# Patient Record
Sex: Male | Born: 2005 | Race: White | Hispanic: No | Marital: Single | Smoking: Never smoker
Health system: Southern US, Community
[De-identification: ages and names within clinical notes are randomized; demographics above are authoritative.]

## PROBLEM LIST (undated history)

## (undated) DIAGNOSIS — T7840XA Allergy, unspecified, initial encounter: Secondary | ICD-10-CM

## (undated) DIAGNOSIS — F840 Autistic disorder: Secondary | ICD-10-CM

## (undated) DIAGNOSIS — J45909 Unspecified asthma, uncomplicated: Secondary | ICD-10-CM

## (undated) DIAGNOSIS — K200 Eosinophilic esophagitis: Secondary | ICD-10-CM

## (undated) HISTORY — PX: UPPER GASTROINTESTINAL ENDOSCOPY: SHX188

## (undated) HISTORY — DX: Eosinophilic esophagitis: K20.0

## (undated) HISTORY — DX: Autistic disorder: F84.0

## (undated) HISTORY — DX: Allergy, unspecified, initial encounter: T78.40XA

## (undated) HISTORY — PX: CIRCUMCISION: SUR203

---
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
## 2023-01-08 ENCOUNTER — Encounter (INDEPENDENT_AMBULATORY_CARE_PROVIDER_SITE_OTHER): Payer: Self-pay | Admitting: Otolaryngology

## 2023-01-08 ENCOUNTER — Ambulatory Visit: Payer: BC Managed Care – PPO | Admitting: Family Medicine

## 2023-01-08 ENCOUNTER — Encounter: Payer: Self-pay | Admitting: Family Medicine

## 2023-01-08 VITALS — BP 116/78 | HR 97 | Temp 98.0°F | Resp 16 | Ht 72.0 in | Wt 217.6 lb

## 2023-01-08 DIAGNOSIS — R0989 Other specified symptoms and signs involving the circulatory and respiratory systems: Secondary | ICD-10-CM

## 2023-01-08 NOTE — Patient Instructions (Signed)
 If you do not hear anything about your referral in the next 1-2 weeks, call our office and ask for an update.  Let me know if anything changes.

## 2023-01-08 NOTE — Progress Notes (Signed)
 Chief Complaint  Patient presents with   Sore Throat    Sore throat     Subjective: Patient is a 17 y.o. male here for throat tightness. Here w mom.   7-10 d ago, started having tightness in his throat. No trouble swallowing pills, liquids, solids. No pain, trauma, sinus drainage, swelling, itching, rashes. Not improving. No inciting event. Has not tried anything at home thus far.   Past Medical History:  Diagnosis Date   Asthma due to environmental allergies    cats, illness   Autistic disorder, current or active state    Eosinophilic esophagitis     Objective: BP 116/78   Pulse 97   Temp 98 F (36.7 C) (Oral)   Resp 16   Ht 6' (1.829 m)   Wt 217 lb 9.6 oz (98.7 kg)   SpO2 98%   BMI 29.51 kg/m  General: Awake, appears stated age Neck: No asymmetry or masses; denies any TTP but did seem uncomfortable when palpating his SCM musculature and thyroid; no thyromegaly Mouth: MMM, no asymmetry or edema appreciated, no pharyngeal exudate or erythema Heart: RRR Lungs: CTAB, no rales, wheezes or rhonchi. No accessory muscle use Psych: normal affect  Assessment and Plan: Throat tightness - Plan: Ambulatory referral to ENT  I do not appreciate any thyromegaly today.  Unlikely to be thyroiditis.  Will refer to ENT for further evaluation and possible scope. The patient and his mother voiced understanding and agreement to the plan.  Mabel [place], DO 01/08/23  1:39 PM

## 2023-01-15 ENCOUNTER — Encounter (INDEPENDENT_AMBULATORY_CARE_PROVIDER_SITE_OTHER): Payer: Self-pay | Admitting: Otolaryngology

## 2023-03-14 ENCOUNTER — Encounter (INDEPENDENT_AMBULATORY_CARE_PROVIDER_SITE_OTHER): Payer: Self-pay | Admitting: Otolaryngology

## 2023-03-14 ENCOUNTER — Ambulatory Visit (INDEPENDENT_AMBULATORY_CARE_PROVIDER_SITE_OTHER): Payer: Self-pay | Admitting: Otolaryngology

## 2023-03-14 VITALS — BP 111/71 | HR 91 | Ht 72.0 in | Wt 220.0 lb

## 2023-03-14 DIAGNOSIS — K219 Gastro-esophageal reflux disease without esophagitis: Secondary | ICD-10-CM

## 2023-03-14 DIAGNOSIS — J385 Laryngeal spasm: Secondary | ICD-10-CM

## 2023-03-14 DIAGNOSIS — R0989 Other specified symptoms and signs involving the circulatory and respiratory systems: Secondary | ICD-10-CM

## 2023-03-14 DIAGNOSIS — R0982 Postnasal drip: Secondary | ICD-10-CM | POA: Diagnosis not present

## 2023-03-14 DIAGNOSIS — K200 Eosinophilic esophagitis: Secondary | ICD-10-CM | POA: Diagnosis not present

## 2023-03-14 DIAGNOSIS — J3089 Other allergic rhinitis: Secondary | ICD-10-CM | POA: Diagnosis not present

## 2023-03-14 DIAGNOSIS — J343 Hypertrophy of nasal turbinates: Secondary | ICD-10-CM

## 2023-03-14 MED ORDER — FLUTICASONE PROPIONATE 50 MCG/ACT NA SUSP: 2.0000 | Freq: Every day | NASAL | 6 refills | Status: DC

## 2023-03-14 NOTE — Progress Notes (Signed)
 ENT CONSULT:  Reason for Consult: throat tightness    HPI: Discussed the use of AI scribe software for clinical note transcription with the patient, who gave verbal consent to proceed.  History of Present Illness   Wayne Richardson is a 18 year old male with hx of autism, eosinophilic esophagitis who presents with throat tightness at night. He is accompanied by a caregiver who assists with communication during the visit.  He experiences intermittent throat tightness, describing the sensation as 'weird' and is mostly happening at night. No pain when swallowing, shortness of breath, or difficulty with talking or breathing. He was previously seen by [facility] peds GI for EoE and based on record review is considered in remission. He is on PPI 40 mg BID.   He has a history of eosinophilic esophagitis diagnosed approximately two years ago, with multiple upper endoscopies performed for diagnostic purposes. He has not been on steroids for this condition.  He is currently taking Omeprazole, a proton pump inhibitor, at a dose of 40 mg twice daily. He manages environmental allergies, including dust and pollen, with Claritin, having recently switched from Zyrtec. He does not use nasal sprays regularly.      Records Reviewed:  [facility] 03/10/22 History of Present Illness: Wayne Richardson is a 18 y.o. old male with atopy, autism, and EOE who presents to clinic for follow up evaluation. Wayne Richardson was last seen on 05/09/21.   Patient had EGD in 03/23/21 that did not show any eosinophils in esophagus and at follow up appointment in 05/2021, patient appeared in clinical remission.   Today, patient continues to feel well. He denies any dysphagia, odynophagia, need to regurgitate or vomit the ingested food. He has been able to eat well and maintain weight. He denies nausea, abdominal pain, diarrhea, constipation, hematochezia, melena, weight loss, yellow skin discoloration, abdominal distention, confusion. He remains  compliant with PPI BID and takes one 30 min before breakfast and one before 30 min before supper. Denies NSAID's use, smoking, alcohol or drug use.   03/23/21 EGD: On PPI BID Final Pathologic Diagnosis   A. "DUODENUM", BIOPSY: Peptic duodenitis (foveolar metaplasia and chronic inflammation). No evidence of celiac disease. B. "DISTAL ESOPHAGUS", BIOPSY: Esophageal squamous mucosa with no diagnostic abnormality. No eosinophils. C. "MID ESOPHAGUS", BIOPSY: Esophageal squamous mucosa with no diagnostic abnormality. No eosinophils. D. "PROXIMAL ESOPHAGUS", BIOPSY: Esophageal squamous mucosa with no diagnostic abnormality. No eosinophils.     Past Medical History:  Diagnosis Date   Asthma due to environmental allergies    cats, illness   Autistic disorder, current or active state    Eosinophilic esophagitis     Past Surgical History:  Procedure Laterality Date   FOREARM SURGERY  2018    Family History  Problem Relation Age of Onset   Cancer Father        sarcoma   Heart disease Neg Hx     Social History:  reports that he has never smoked. He has never used smokeless tobacco. He reports that he does not drink alcohol and does not use drugs.  Allergies:  Allergies  Allergen Reactions   Erythromycin Rash    Rash on arms and legs.    Medications: I have reviewed the patient's current medications.  The PMH, PSH, Medications, Allergies, and SH were reviewed and updated.  ROS: Constitutional: Negative for fever, weight loss and weight gain. Cardiovascular: Negative for chest pain and dyspnea on exertion. Respiratory: Is not experiencing shortness of breath at rest. Gastrointestinal:  Negative for nausea and vomiting. Neurological: Negative for headaches. Psychiatric: The patient is not nervous/anxious  Blood pressure 111/71, pulse 91, height 6' (1.829 m), weight 220 lb (99.8 kg), SpO2 97%. Body mass index is 29.84 kg/m.  PHYSICAL EXAM:  Exam: General:  Well-developed, well-nourished Communication and Voice: Clear pitch and clarity Respiratory Respiratory effort: Equal inspiration and expiration without stridor Cardiovascular Peripheral Vascular: Warm extremities with equal color/perfusion Eyes: No nystagmus with equal extraocular motion bilaterally Neuro/Psych/Balance: Patient oriented to person, place, and time; Appropriate mood and affect; Gait is intact with no imbalance; Cranial nerves I-XII are intact Head and Face Inspection: Normocephalic and atraumatic without mass or lesion Palpation: Facial skeleton intact without bony stepoffs Salivary Glands: No mass or tenderness Facial Strength: Facial motility symmetric and full bilaterally ENT Pinna: External ear intact and fully developed External canal: Canal is patent with intact skin Tympanic Membrane: Clear and mobile External Nose: No scar or anatomic deformity Internal Nose: Septum is deviated to the left. No polyp, or purulence. Mucosal edema and erythema present.  Bilateral inferior turbinate hypertrophy.  Lips, Teeth, and gums: Mucosa and teeth intact and viable TMJ: No pain to palpation with full mobility Oral cavity/oropharynx: No erythema or exudate, no lesions present Nasopharynx: No mass or lesion with intact mucosa Hypopharynx: Intact mucosa without pooling of secretions Larynx Glottic: Full true vocal cord mobility without lesion or mass Supraglottic: Normal appearing epiglottis and AE folds Interarytenoid Space: Moderate pachydermia&edema Subglottic Space: Patent without lesion or edema Neck Neck and Trachea: Midline trachea without mass or lesion Thyroid: No mass or nodularity Lymphatics: No lymphadenopathy  Procedure: Preoperative diagnosis: throat tightness episodes   Postoperative diagnosis:   Same + GERD LPR  Procedure: Flexible fiberoptic laryngoscopy  Surgeon: Ashok Croon, MD  Anesthesia: Topical lidocaine and Afrin Complications:  None Condition is stable throughout exam  Indications and consent:  The patient presents to the clinic with above symptoms. Indirect laryngoscopy view was incomplete. Thus it was recommended that they undergo a flexible fiberoptic laryngoscopy. All of the risks, benefits, and potential complications were reviewed with the patient preoperatively and verbal informed consent was obtained.  Procedure: The patient was seated upright in the clinic. Topical lidocaine and Afrin were applied to the nasal cavity. After adequate anesthesia had occurred, I then proceeded to pass the flexible telescope into the nasal cavity. The nasal cavity was patent without rhinorrhea or polyp. The nasopharynx was also patent without mass or lesion. The base of tongue was visualized and was normal. There were no signs of pooling of secretions in the piriform sinuses. The true vocal folds were mobile bilaterally. There were no signs of glottic or supraglottic mucosal lesion or mass. There was moderate interarytenoid pachydermia and post cricoid edema. The telescope was then slowly withdrawn and the patient tolerated the procedure throughout.     Assessment/Plan: Encounter Diagnoses  Name Primary?   Eosinophilic esophagitis    Throat tightness    Chronic GERD    Laryngospasm    Sleep related laryngospasm Yes   Environmental and seasonal allergies    Hypertrophy of both inferior nasal turbinates    Post-nasal drip     Assessment and Plan    Throat tightness  Episodes of throat tightness mostly at night, hx of GERD and EoE, on PPI 40 mg Omeprazole BID. Considered in remission for EoE based on 2023 notes from Peds GI. Denies dysphagia or odynophagia.  Flexible scope exam with no masses or lesions no adduction on inspiration, subglottis proximal trachea  patent w/o scar - likely GERD triggered laryngospasm 2/2 sx description and nocturnal sx  - continue PPI 40 mg BID  -  Reflux Gourmet after meals and prior to bedtime   - diet and lifestyle changes to minimize GERD - Refer to [employer] blog for dietary and lifestyle modifications/reflux cook book   Eosinophilic esophagitis (EOE) Diagnosed with EOE approximately two years ago, currently managed with Omeprazole (PPI) 40 mg twice daily. Records indicate he was considered in remission in 2023, no adult GI yet, was recently discharged from Peds GI 2/2 age. We discussed that EOE is chronic and can relapse, typically managed by GI specialists. PPIs are beneficial for his anti-inflammatory properties and acid control. . - Continue Comprazole 40 mg twice daily - refer to adult GI to establish care   Environmental allergies and chronic nasal congestion post-nasal drip Environmental allergies managed with Claritin. Switched from Zyrtec to Claritin two weeks ago. Allergies may contribute to postnasal drainage, potentially exacerbating throat symptoms. Flonase nasal spray recommended for improved control of postnasal drainage. - Continue Claritin for allergy management - Add Flonase nasal spray for additional control of postnasal drainage  Thank you for allowing me to participate in the care of this patient. Please do not hesitate to contact me with any questions or concerns.   Ashok Croon, MD Otolaryngology [facility] Phone: [phone] Fax: [phone]    03/14/2023, 10:37 AM

## 2023-03-14 NOTE — Patient Instructions (Signed)

## 2023-03-19 ENCOUNTER — Other Ambulatory Visit: Payer: Self-pay | Admitting: Family Medicine

## 2023-04-01 ENCOUNTER — Encounter: Payer: Self-pay | Admitting: Family Medicine

## 2023-04-01 ENCOUNTER — Ambulatory Visit (INDEPENDENT_AMBULATORY_CARE_PROVIDER_SITE_OTHER): Admitting: Family Medicine

## 2023-04-01 VITALS — BP 128/76 | HR 102 | Ht 72.0 in | Wt 212.6 lb

## 2023-04-01 DIAGNOSIS — K200 Eosinophilic esophagitis: Secondary | ICD-10-CM

## 2023-04-01 DIAGNOSIS — R195 Other fecal abnormalities: Secondary | ICD-10-CM

## 2023-04-01 NOTE — Progress Notes (Signed)
 Chief Complaint  Patient presents with   Acute Visit    Patient presents today for constipation for a month. He is taking miralax that's not helping.    Subjective: Patient is a 18 y.o. male here for constipation. Here w mom.   This is been going on for 1 month.  He tried MiraLAX at home without relief.  No bleeding, abdominal pain, unintentional weight loss, nausea, vomiting.  He is passing gas. Goes once a day. Points to type 6 stool on BSC.  Of note, he does have a history of eosinophilic esophagitis and does not have a new GI doctor at this time.  Past Medical History:  Diagnosis Date   Asthma due to environmental allergies    cats, illness   Autistic disorder, current or active state    Eosinophilic esophagitis     Objective: BP 128/76   Pulse (!) 102   Ht 6' (1.829 m)   Wt 212 lb 9.6 oz (96.4 kg)   SpO2 99%   BMI 28.83 kg/m  General: Awake, appears stated age Mouth: MMM Heart: RRR, no LE edema Lungs: CTAB, no rales, wheezes or rhonchi. No accessory muscle use Abdomen: Bowel sounds present, soft, nontender, nondistended Psych: Flat affect.  Assessment and Plan: Eosinophilic esophagitis - Plan: Ambulatory referral to Gastroenterology  Loose stools - Plan: Ambulatory referral to Gastroenterology  Refer to GI now that he is an adult.  Stay hydrated.  Fiber supplement recommended.  Could possibly be IBS-D.  If no improvement in no soon specialty appointment, I will see him next month. The patient and his mother voiced understanding and agreement to the plan.  Jilda Roche Ben Bolt, DO 04/01/23  4:52 PM

## 2023-04-01 NOTE — Patient Instructions (Addendum)
 Try to drink 55-60 oz of water daily outside of exercise.  Take Metamucil or Benefiber daily.  If you do not hear anything about your referral in the next 1-2 weeks, call our office and ask for an update.  If not improving in the next 3-4 weeks, please come back to see [place] to follow up.   Let us know if you need anything.

## 2023-04-29 ENCOUNTER — Telehealth: Payer: Self-pay | Admitting: Gastroenterology

## 2023-04-29 ENCOUNTER — Encounter: Payer: Self-pay | Admitting: Gastroenterology

## 2023-04-29 NOTE — Telephone Encounter (Signed)
 error

## 2023-06-25 ENCOUNTER — Ambulatory Visit: Admitting: Gastroenterology

## 2023-06-25 ENCOUNTER — Encounter: Payer: Self-pay | Admitting: Gastroenterology

## 2023-06-25 VITALS — BP 120/72 | HR 112 | Ht 72.0 in | Wt 208.0 lb

## 2023-06-25 DIAGNOSIS — K5900 Constipation, unspecified: Secondary | ICD-10-CM

## 2023-06-25 DIAGNOSIS — K200 Eosinophilic esophagitis: Secondary | ICD-10-CM

## 2023-06-25 DIAGNOSIS — F840 Autistic disorder: Secondary | ICD-10-CM

## 2023-06-25 NOTE — Progress Notes (Signed)
 Discussed the use of AI scribe software for clinical note transcription with the patient, who gave verbal consent to proceed.  HPI : Wayne Richardson is a 18 year old male with autism and eosinophilic esophagitis who presents with swallowing difficulties and constipation. He was referred by Dr. Gwenette Lennox.  He is accompanied by his mother who provides much of the history.  It appears he was diagnosed with EoE in 2023 by [facility] peds GI (Dr. Zuar) after an EGD to evaluate dysphagia was noted for endoscopic features of EoE (generalized edema/decreased vascularity of esophagus).  Biopsies showed elevated eosinophils in the distal esophagus, but not mid or proximal esophagus.  He was treated with BID PPI and underwent repeat EGD in Jan 2023 with resolution of eosinophilia/inflammation.  No history of elimination diets or allergy testing was pursued.  His dysphagia resolved completely, but he has been experiencing recurrent swallowing difficulties for the past few months, characterized by a sensation of tightness when swallowing. This sensation is less severe than before. He needs to 'wash down' solid foods like chicken with liquids, although softer foods like yogurt and ice cream are easier to swallow. No episodes of food impaction requiring endoscopic removal have occurred. He is currently taking omeprazole  40 mg twice daily, which he has been on for several years.  No heartburn or acid regurgitation.  He also reports constipation issues that have persisted for a few months. He describes a sensation of incomplete evacuation, feeling like he has not fully emptied his bowels despite having daily bowel movements. His stools vary in consistency, sometimes being hard and small. No diarrhea, fecal incontinence, or blood in the stool. He has been taking Benefiber daily for a couple of months without significant improvement.  There have been no recent dietary changes or new medications aside from a temporary switch  from Zyrtec to generic Allegra for allergic rhinitis, before returning to Zyrtec. He has a history of seasonal allergies managed with Zyrtec and inhalers.      EGD Jan 12, 2021 Findings Moderate, generalized edematous mucosa with loss of vascular pattern in the upper third of the esophagus, middle third of the esophagus and lower third of the esophagus, consistent with eosinophilic esophagitis; performed cold forceps biopsy The stomach appeared normal. Performed random biopsy using biopsy forceps. Mild, patchy erythematous and nodular mucosa in the duodenal bulb; performed cold forceps biopsy. The rest of the duodenum appeared normal   A. DUODENUM, BIOPSY: Duodenal mucosa with preserved villoglandular architecture without increased intraepithelial lymphocytes or evidence of active inflammation. No evidence of gluten sensitive enteropathy. B. DUODENAL BULB, BIOPSY: Duodenal mucosa with foveolar metaplasia and brunner gland hyperplasia, consistent with chronic peptic duodenitis. Negative for dysplasia. C. STOMACH, BIOPSY: Gastric antral/oxyntic mucosa with chronic inactive gastritis. Negative for H. pylori microorganisms on H&E. D. ESOPHAGUS, DISTAL, BIOPSY: Esophageal squamous mucosa with marked reactive epithelial changes and intraepithelial eosinophils (up to 20 eosinophils per high-power field). See comment. E. ESOPHAGUS, MID, BIOPSY: Esophageal squamous mucosa with marked reactive epithelial changes and intraepithelial eosinophils (up to 2 eosinophils per high-power field). See comment. F. ESOPHAGUS, PROXIMAL, BIOPSY: Esophageal squamous mucosa with no significant diagnostic alteration. No evidence of intraepithelial eosinophils or lymphocytes   EGD March 23, 2021 On BID PPI Findings The cardia, fundus of the stomach, body of the stomach, greater curve of the stomach, lesser curve of the stomach, prepyloric region and pylorus appeared normal. Mild, localized nodular mucosa in the duodenal  bulb; performed cold forceps biopsy Mild, generalized abnormal mucosa  with linear furrows and loss of vascular pattern in the esophagus; performed cold forceps biopsy   A. DUODENUM, BIOPSY: Peptic duodenitis (foveolar metaplasia and chronic inflammation). No evidence of celiac disease. B. DISTAL ESOPHAGUS, BIOPSY: Esophageal squamous mucosa with no diagnostic abnormality. No eosinophils. C. MID ESOPHAGUS, BIOPSY: Esophageal squamous mucosa with no diagnostic abnormality. No eosinophils. D. PROXIMAL ESOPHAGUS, BIOPSY: Esophageal squamous mucosa with no diagnostic abnormality. No eosinophils.    Past Medical History:  Diagnosis Date   Asthma due to environmental allergies    cats, illness   Autistic disorder, current or active state    Eosinophilic esophagitis      Past Surgical History:  Procedure Laterality Date   FOREARM SURGERY  2018   Family History  Problem Relation Age of Onset   Cancer Father        sarcoma   Heart disease Neg Hx    Social History   Tobacco Use   Smoking status: Never   Smokeless tobacco: Never  Substance Use Topics   Alcohol use: Never   Drug use: Never   Current Outpatient Medications  Medication Sig Dispense Refill   cetirizine (ZYRTEC) 10 MG tablet Take 10 mg by mouth daily. 1 po q d.     fluticasone  (FLONASE ) 50 MCG/ACT nasal spray Place 2 sprays into both nostrils daily. 16 g 6   omeprazole  (PRILOSEC) 40 MG capsule TAKE 1 CAPSULE (40 MG TOTAL) BY MOUTH IN THE MORNING AND AT BEDTIME. 180 capsule 3   VENTOLIN HFA 108 (90 BASE) MCG/ACT inhaler Inhale 2 puffs into the lungs as needed. 2 Puffs PRN     No current facility-administered medications for this visit.   Allergies  Allergen Reactions   Erythromycin Rash    Rash on arms and legs.     Review of Systems: All systems reviewed and negative except where noted in HPI.    No results found.  Physical Exam: BP 120/72   Pulse (!) 112   Ht 6' (1.829 m)   Wt 208 lb  (94.3 kg)   SpO2 97%   BMI 28.21 kg/m  Constitutional: Pleasant,well-developed, Caucasian male in no acute distress. Accompanied by mother HEENT: Normocephalic and atraumatic. Conjunctivae are normal. No scleral icterus. Neck supple.  Cardiovascular: Normal rate, regular rhythm.  Pulmonary/chest: Effort normal and breath sounds normal. No wheezing, rales or rhonchi. Abdominal: Soft, nondistended, nontender. Bowel sounds active throughout. There are no masses palpable. No hepatomegaly. Extremities: no edema Lymphadenopathy: No cervical adenopathy noted. Neurological: Alert and oriented to person place and time.  Patient is very quiet and responses are very brief but are appropriate and corroborated by mother.  Poor eye contact. Skin: Skin is warm and dry. No rashes noted. Psychiatric: Normal mood and affect. Behavior is normal.  CBC No results found for: WBC, RBC, HGB, HCT, PLT, MCV, MCH, MCHC, RDW, LYMPHSABS, MONOABS, EOSABS, BASOSABS  CMP  No results found for: NA, K, CL, CO2, GLUCOSE, BUN, CREATININE, CALCIUM, PROT, ALBUMIN, AST, ALT, ALKPHOS, BILITOT, GFRNONAA, GFRAA      No data to display            ASSESSMENT AND PLAN:  18 year old autistic male with EoE diagnosed in 2023, treated with BID PPI with histologic and clinical remission, with recent recurrence of dysphagia symptoms despite medication adherence to BID PPI.  Also with new onset constipation manifested as sensation of incomplete evacuation.  Eosinophilic esophagitis Dysphagia persists despite omeprazole . Uncontrolled eosinophilic esophagitis suspected. Discussed EGD with biopsy to assess disease  activity.  Patient tolerated IV placement and IV sedation well previously. - EGD with biopsies to assess inflammation and eosinophilic infiltration.  Dilation can be performed if stricture present. - Continue omeprazole  40 mg twice daily. - Consider adding  fluticasone  swallowed if elevated eosinophils confirmed on biopsy.  Constipation Incomplete evacuation and straining persist despite Benefiber. Discussed dietary and lifestyle modifications. Considered Metamucil and Senna for relief. - Recommend Metamucil as alternative fiber supplement. - Prescribe Senna 1-2 tablets at night as needed if no bowel movement for two days. - Encourage increased water intake and physical activity.  Seasonal allergic rhinitis Symptoms well-controlled with Zyrtec and inhalers. Temporary switch to generic Allegra without significant impact.  Recording duration: 16 minutes     The details, risks (including bleeding, perforation, infection, missed lesions, medication reactions and possible hospitalization or surgery if complications occur), benefits, and alternatives to EGD with possible biopsy and possible dilation were discussed with the patient and he/she consents to proceed.   Laniece Hornbaker E. Cherryl Corona, MD [facility]    Gwenette Lennox, Shellie Dials*

## 2023-06-25 NOTE — Patient Instructions (Addendum)
 Please purchase Metamucil over the counter. Take daily.   Please purchase the following medications over the counter and take as directed: Senokot 8.6 mg - take 1-2 tablets by mouth as needed at night  You have been scheduled for an endoscopy. Please follow written instructions given to you at your visit today.  If you use inhalers (even only as needed), please bring them with you on the day of your procedure.  If you take any of the following medications, they will need to be adjusted prior to your procedure:   DO NOT TAKE 7 DAYS PRIOR TO TEST- Trulicity (dulaglutide) Ozempic, Wegovy (semaglutide) Mounjaro (tirzepatide) Bydureon Bcise (exanatide extended release)  DO NOT TAKE 1 DAY PRIOR TO YOUR TEST Rybelsus (semaglutide) Adlyxin (lixisenatide) Victoza (liraglutide) Byetta (exanatide) ___________________________________________________________________________  _______________________________________________________  If your blood pressure at your visit was 140/90 or greater, please contact your primary care physician to follow up on this.  _______________________________________________________  If you are age 59 or older, your body mass index should be between 23-30. Your Body mass index is 28.21 kg/m. If this is out of the aforementioned range listed, please consider follow up with your Primary Care Provider.  If you are age 68 or younger, your body mass index should be between 19-25. Your Body mass index is 28.21 kg/m. If this is out of the aformentioned range listed, please consider follow up with your Primary Care Provider.   ________________________________________________________  The [facility] providers would like to encourage you to use MYCHART to communicate with providers for non-urgent requests or questions.  Due to long hold times on the telephone, sending your provider a message by [facility] may be a faster and more efficient way to get a response.  Please allow 48  business hours for a response.  Please remember that this is for non-urgent requests.  _______________________________________________________

## 2023-07-29 ENCOUNTER — Encounter: Payer: Self-pay | Admitting: Gastroenterology

## 2023-07-29 ENCOUNTER — Ambulatory Visit (AMBULATORY_SURGERY_CENTER): Admitting: Gastroenterology

## 2023-07-29 VITALS — BP 101/51 | HR 91 | Temp 99.3°F | Resp 20 | Ht 72.0 in | Wt 208.0 lb

## 2023-07-29 DIAGNOSIS — K200 Eosinophilic esophagitis: Secondary | ICD-10-CM

## 2023-07-29 MED ORDER — SODIUM CHLORIDE 0.9 % IV SOLN: 500.0000 mL | Freq: Once | INTRAVENOUS | Status: DC

## 2023-07-29 NOTE — Progress Notes (Signed)
 Transferred to PACU via stretcher, arousing, VSS.

## 2023-07-29 NOTE — Patient Instructions (Signed)

## 2023-07-29 NOTE — Progress Notes (Signed)
 Updated medical record.

## 2023-07-29 NOTE — Progress Notes (Signed)
 [facility] History and Physical   Primary Care Physician:  Frann Mabel Mt, DO   Reason for Procedure:   Follow up eosinophilic esophagitis  Plan:    EGD     HPI: Wayne Richardson is a 18 y.o. male undergoing EGD to reassess eosinophilic esophagitis.  He was diagnosed with EoE in 2023 and treated with PPI with resolution of eosinophilia and symptoms.  Symptoms (dysphagia) recently returned despite BID PPI.  No changes in symptoms since his office visit last month   Past Medical History:  Diagnosis Date   Allergy    Asthma due to environmental allergies    cats, illness   Autistic disorder, current or active state    Eosinophilic esophagitis     Past Surgical History:  Procedure Laterality Date   FOREARM SURGERY  2018   UPPER GASTROINTESTINAL ENDOSCOPY      Prior to Admission medications   Medication Sig Start Date End Date Taking? Authorizing Provider  cetirizine (ZYRTEC) 10 MG tablet Take 10 mg by mouth daily. 1 po q d.   Yes [provider]  fluticasone  (FLONASE ) 50 MCG/ACT nasal spray Place 2 sprays into both nostrils daily. 03/14/23  Yes Soldatova, Liuba, MD  omeprazole  (PRILOSEC) 40 MG capsule TAKE 1 CAPSULE (40 MG TOTAL) BY MOUTH IN THE MORNING AND AT BEDTIME. 03/19/23  Yes Wendling, Mabel Mt, DO  Wheat Dextrin (BENEFIBER) TABS Take by mouth.   Yes [provider]  VENTOLIN HFA 108 (90 BASE) MCG/ACT inhaler Inhale 2 puffs into the lungs as needed. 2 Puffs PRN 05/02/12   [provider]    Current Outpatient Medications  Medication Sig Dispense Refill   cetirizine (ZYRTEC) 10 MG tablet Take 10 mg by mouth daily. 1 po q d.     fluticasone  (FLONASE ) 50 MCG/ACT nasal spray Place 2 sprays into both nostrils daily. 16 g 6   omeprazole  (PRILOSEC) 40 MG capsule TAKE 1 CAPSULE (40 MG TOTAL) BY MOUTH IN THE MORNING AND AT BEDTIME. 180 capsule 3   Wheat Dextrin (BENEFIBER) TABS Take by mouth.     VENTOLIN HFA 108 (90 BASE)  MCG/ACT inhaler Inhale 2 puffs into the lungs as needed. 2 Puffs PRN     Current Facility-Administered Medications  Medication Dose Route Frequency Provider Last Rate Last Admin   0.9 %  sodium chloride  infusion  500 mL Intravenous Once Stacia Glendia FORBES, MD        Allergies as of 07/29/2023 - Review Complete 07/29/2023  Allergen Reaction Noted   Erythromycin Rash 06/18/2012    Family History  Problem Relation Age of Onset   Cancer Father        sarcoma   Heart disease Neg Hx    Colon cancer Neg Hx    Esophageal cancer Neg Hx    Stomach cancer Neg Hx    Rectal cancer Neg Hx     Social History   Socioeconomic History   Marital status: Single    Spouse name: Not on file   Number of children: Not on file   Years of education: Not on file   Highest education level: 12th grade  Occupational History   Not on file  Tobacco Use   Smoking status: Never   Smokeless tobacco: Never  Vaping Use   Vaping status: Never Used  Substance and Sexual Activity   Alcohol use: Never   Drug use: Never   Sexual activity: Not Currently  Other Topics Concern   Not on file  Social History Narrative   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 Bleu does well in school. He lives with his parents and brother. Wynn enjoys school, video games, and playing with his brother.   Social Drivers of Corporate investment banker Strain: Low Risk  (03/29/2023)   Overall Financial Resource Strain (CARDIA)    Difficulty of Paying Living Expenses: Not very hard  Food Insecurity: No Food Insecurity (03/29/2023)   Hunger Vital Sign    Worried About Running Out of Food in the Last Year: Never true    Ran Out of Food in the Last Year: Never true  Transportation Needs: No Transportation Needs (03/29/2023)   PRAPARE - Administrator, Civil Service (Medical): No    Lack of Transportation (Non-Medical): No  Physical Activity: Unknown (03/29/2023)   Exercise Vital Sign    Days of Exercise  per Week: 0 days    Minutes of Exercise per Session: Not on file  Stress: No Stress Concern Present (03/29/2023)   [employer] of Occupational Health - Occupational Stress Questionnaire    Feeling of Stress : Only a little  Social Connections: Socially Isolated (03/29/2023)   Social Connection and Isolation Panel    Frequency of Communication with Friends and Family: Never    Frequency of Social Gatherings with Friends and Family: Twice a week    Attends Religious Services: Never    Database administrator or Organizations: No    Attends Engineer, structural: Not on file    Marital Status: Never married  Intimate Partner Violence: Not on file    Review of Systems:  All other review of systems negative except as mentioned in the HPI.  Physical Exam: Vital signs BP 138/88   Pulse (!) 110   Temp 99.3 F (37.4 C) (Temporal)   Ht 6' (1.829 m)   Wt 208 lb (94.3 kg)   SpO2 97%   BMI 28.21 kg/m   General:   Alert,  Well-developed, well-nourished, pleasant and cooperative in NAD Airway:  Mallampati 1 Lungs:  Clear throughout to auscultation.   Heart:  Regular rate and rhythm; no murmurs, clicks, rubs,  or gallops. Abdomen:  Soft, nontender and nondistended. Normal bowel sounds.   Neuro/Psych:  Normal mood and affect. A and O x 3   Vertis Bauder E. Stacia, MD [facility]

## 2023-07-29 NOTE — Op Note (Signed)
 [facility] Patient Name: Wayne Richardson Procedure Date: 07/29/2023 9:36 AM MRN: [id number] Endoscopist: Glendia E. Stacia , MD, [id number] Age: 18 Referring MD:  Date of Birth: [birth year] Gender: Male Account #: [id number] Procedure:                Upper GI endoscopy Indications:              Follow-up of eosinophilic esophagitis Medicines:                Monitored Anesthesia Care Procedure:                Pre-Anesthesia Assessment:                           - Prior to the procedure, a History and Physical                            was performed, and patient medications and                            allergies were reviewed. The patient's tolerance of                            previous anesthesia was also reviewed. The risks                            and benefits of the procedure and the sedation                            options and risks were discussed with the patient.                            All questions were answered, and informed consent                            was obtained. Prior Anticoagulants: The patient has                            taken no anticoagulant or antiplatelet agents. ASA                            Grade Assessment: II - A patient with mild systemic                            disease. After reviewing the risks and benefits,                            the patient was deemed in satisfactory condition to                            undergo the procedure.                           After obtaining informed consent, the endoscope was  passed under direct vision. Throughout the                            procedure, the patient's blood pressure, pulse, and                            oxygen saturations were monitored continuously. The                            Olympus scope [id number] was introduced through the                            mouth, and advanced to the second part of duodenum.                            The upper GI  endoscopy was accomplished without                            difficulty. The patient tolerated the procedure                            well. Scope In: Scope Out: Findings:                 The examined portions of the nasopharynx,                            oropharynx and larynx were normal.                           The examined esophagus was normal. Biopsies were                            obtained from the proximal and distal esophagus                            with cold forceps for histology of suspected                            eosinophilic esophagitis.                           The entire examined stomach was normal.                           The examined duodenum was normal. Complications:            No immediate complications. Estimated Blood Loss:     Estimated blood loss was minimal. Impression:               - The examined portions of the nasopharynx,                            oropharynx and larynx were normal.                           - Normal  esophagus. No endoscopic evidence of                            active EoE.                           - Normal stomach.                           - Normal examined duodenum.                           - Biopsies were taken with a cold forceps for                            evaluation of eosinophilic esophagitis. Recommendation:           - Patient has a contact number available for                            emergencies. The signs and symptoms of potential                            delayed complications were discussed with the                            patient. Return to normal activities tomorrow.                            Written discharge instructions were provided to the                            patient.                           - Resume previous diet.                           - Continue present medications.                           - Await pathology results. Taro Hidrogo E. Stacia, MD 07/29/2023 10:05:29 AM This  report has been signed electronically.

## 2023-07-29 NOTE — Progress Notes (Signed)
 Called to room to assist during endoscopic procedure.  Patient ID and intended procedure confirmed with present staff. Received instructions for my participation in the procedure from the performing physician.

## 2023-07-30 ENCOUNTER — Telehealth: Payer: Self-pay

## 2023-07-30 NOTE — Telephone Encounter (Signed)
  Follow up Call-     07/29/2023    9:01 AM  Call back number  Post procedure Call Back phone  # [phone]- mom eleanor  Permission to leave phone message Yes     Attempted to call patient regarding follow-up. No answer, VM left on mother's (Melissa's) phone.

## 2023-07-31 LAB — SURGICAL PATHOLOGY

## 2023-08-04 ENCOUNTER — Ambulatory Visit: Payer: Self-pay | Admitting: Gastroenterology

## 2023-08-04 NOTE — Progress Notes (Signed)
 Wayne Richardson,  Good news: the biopsies of your esophagus were completely normal.  There was no evidence of inflammation from either eosinophilic esophagitis or acid reflux. I would continue your omeprazole .  I would even suggest decreasing it to once a day and see if there is any difference in symptoms compared to twice a day.  Follow up with me in the office as needed.

## 2023-08-13 ENCOUNTER — Ambulatory Visit (INDEPENDENT_AMBULATORY_CARE_PROVIDER_SITE_OTHER): Admitting: Family Medicine

## 2023-08-13 VITALS — BP 118/72 | HR 98 | Temp 100.0°F | Resp 16 | Ht 73.0 in | Wt 208.6 lb

## 2023-08-13 DIAGNOSIS — K589 Irritable bowel syndrome without diarrhea: Secondary | ICD-10-CM | POA: Diagnosis not present

## 2023-08-13 MED ORDER — AMITRIPTYLINE HCL 10 MG PO TABS: 10.0000 mg | ORAL_TABLET | Freq: Every day | ORAL | 1 refills | Status: DC

## 2023-08-13 NOTE — Progress Notes (Signed)
 Chief Complaint  Patient presents with   Anxiety    Anxiety    Subjective Wayne Richardson is an 18 y.o. male who presents with anxiety. Here w mom who helps w the hx.  Symptoms began several years ago.  Anxiety symptoms: irritable, psychomotor agitation, Gi issues.  He has abdominal pain, varying types of bowel movements and bloating. Depressive symptoms: none Family history significant for anxiety in brother, mom and maternal grandpa.  Brother also has a history of IBS. No SI or HI.  No self medication. Social stressors include none. He is currently not on a medicine. Was possibly on Prozac  in the past.  He is not following with a psychologist. He saw GI who ruled out sinister pathosis.  Past Medical History:  Diagnosis Date   Allergy    Asthma due to environmental allergies    cats, illness   Autistic disorder, current or active state    Eosinophilic esophagitis      Family History Family History  Problem Relation Age of Onset   Cancer Father        sarcoma   Heart disease Neg Hx    Colon cancer Neg Hx    Esophageal cancer Neg Hx    Stomach cancer Neg Hx    Rectal cancer Neg Hx     Exam BP 118/72 (BP Location: Left Arm, Patient Position: Sitting)   Pulse 98   Temp 100 F (37.8 C) (Oral)   Resp 16   Ht 6' 1 (1.854 m)   Wt 208 lb 9.6 oz (94.6 kg)   SpO2 98%   BMI 27.52 kg/m  General:  well developed, well nourished, in no apparent distress Lungs:  normal respiratory effort without accessory muscle use Abd: BS+, soft, nontender, nondistended Psych: Limited judgement/insight  Assessment and Plan  Irritable bowel syndrome, unspecified type - Plan: amitriptyline  (ELAVIL ) 10 MG tablet  Chronic, not controlled.  Start amitriptyline  10 mg nightly.  Counseling information provided.  Anxiety coping techniques provided.  Counseled on exercise. F/u in 1 mo. Patient's mom voiced understanding and agreement to the plan.  Mabel Mt [place], DO 08/13/23 4:25  PM

## 2023-08-13 NOTE — Patient Instructions (Signed)
Aim to do some physical exertion for 150 minutes per week. This is typically divided into 5 days per week, 30 minutes per day. The activity should be enough to get your heart rate up. Anything is better than nothing if you have time constraints. ° °Please consider counseling. Contact [phone] to schedule an appointment or inquire about cost/insurance coverage. ° °[facility] located at [address], [place], [place].  Phone number = [phone].  Dr. Onoriode Edeh - Adult Psychiatry.  °  °[facility] located at [address], [place], [place]. Phone number = [phone]. °  °[facility] located at [address], [place], [place].  Phone number = [phone]. °  °[facility] located at [address], [place], [place].  Phone number = [phone]. ° °Coping skills °Choose 5 that work for you: °Take a deep breath °Count to 20 °Read a book °Do a puzzle °Meditate °Bake °Sing °Knit °Garden °Pray °Go outside °Call a friend °Listen to music °Take a walk °Color °Send a note °Take a bath °Watch a movie °Be alone in a quiet place °Pet an animal °Visit a friend °Journal °Exercise °Stretch  ° °Let us know if you need anything. °

## 2023-09-08 ENCOUNTER — Other Ambulatory Visit: Payer: Self-pay | Admitting: Family Medicine

## 2023-09-08 DIAGNOSIS — K589 Irritable bowel syndrome without diarrhea: Secondary | ICD-10-CM

## 2023-09-13 ENCOUNTER — Encounter: Payer: Self-pay | Admitting: Family Medicine

## 2023-09-13 ENCOUNTER — Ambulatory Visit: Admitting: Family Medicine

## 2023-09-13 VITALS — BP 118/84 | HR 118 | Temp 98.1°F | Resp 18 | Ht 73.0 in | Wt 208.4 lb

## 2023-09-13 DIAGNOSIS — R1013 Epigastric pain: Secondary | ICD-10-CM

## 2023-09-13 NOTE — Progress Notes (Signed)
 Chief Complaint  Patient presents with   Follow-up    Unchanged     Subjective: Patient is a 18 y.o. male here for f/u.  Here with mom who helps with the history.  The patient has been dealing with upper abdominal pain that comes and goes.  It is not associated with meals, positions, bowel movements, or flatulence.  He has been taking Gas-X without significant relief.  He was recently placed on amitriptyline  10 mg nightly.  No adverse effects, he was compliant with the medication.  Did not notice any effect on his mood or symptoms.  Denies any nausea or vomiting.  He is eating and drinking normally.  He does strain a bit when he has bowel movements and describes the stool as hard and small.  Does not stay well-hydrated drinking around half a liter of water daily.  Past Medical History:  Diagnosis Date   Allergy    Asthma due to environmental allergies    cats, illness   Autistic disorder, current or active state    Eosinophilic esophagitis     Objective: BP 118/84   Pulse (!) 118   Temp 98.1 F (36.7 C)   Resp 18   Ht 6' 1 (1.854 m)   Wt 208 lb 6.4 oz (94.5 kg)   SpO2 96%   BMI 27.50 kg/m  General: Awake, appears stated age Heart: Regular rhythm, tachycardic (reportedly anxious), no LE edema Lungs: CTAB, no rales, wheezes or rhonchi. No accessory muscle use Abdomen: Bowel sounds present, soft, nontender, nondistended Psych: Irritable/agitated at times  Assessment and Plan: Epigastric abdominal pain - Plan: DG Abd 2 Views  I would like to check a plain film of his bowels to rule out constipation/gas buildup.  I do think he does have a level of anxiety but he does not wish to take a medicine just for this.  Stay hydrated, consider fiber supplement.  Will consider milk of magnesia along with prune juice if there is constipation/gas buildup. The patient's mom voiced understanding and agreement to the plan.  Mabel Mt [place], DO 09/13/23  5:00 PM

## 2023-09-13 NOTE — Patient Instructions (Signed)
 Please get your X-ray done at the [facility] in [place]: [address] [place], [place] [address] [phone]  You do not need an appointment for this location.   Consider Beano.  Try to drink 55-60 oz of water daily outside of exercise.  Take Metamucil or Benefiber daily.  Try 2 tablespoons of milk of mag in 4 oz of warm prune juice. Do that and wait a couple hours. If no improvement, try a Dulcolax suppository and then let me know if we are still having issues.   Let us  know if you need anything.

## 2023-09-14 ENCOUNTER — Other Ambulatory Visit (INDEPENDENT_AMBULATORY_CARE_PROVIDER_SITE_OTHER): Payer: Self-pay | Admitting: Otolaryngology

## 2023-09-16 ENCOUNTER — Ambulatory Visit (INDEPENDENT_AMBULATORY_CARE_PROVIDER_SITE_OTHER)

## 2023-09-16 DIAGNOSIS — R1013 Epigastric pain: Secondary | ICD-10-CM

## 2023-09-19 ENCOUNTER — Encounter: Payer: Self-pay | Admitting: Family Medicine

## 2023-09-23 ENCOUNTER — Ambulatory Visit: Payer: Self-pay | Admitting: Family Medicine

## 2023-11-04 ENCOUNTER — Encounter: Payer: Self-pay | Admitting: Family Medicine

## 2023-11-05 ENCOUNTER — Other Ambulatory Visit: Payer: Self-pay | Admitting: Family Medicine

## 2023-11-05 MED ORDER — MONTELUKAST SODIUM 10 MG PO TABS: 10.0000 mg | ORAL_TABLET | Freq: Every day | ORAL | 3 refills | Status: DC

## 2024-01-08 ENCOUNTER — Ambulatory Visit: Admitting: Family Medicine

## 2024-01-08 ENCOUNTER — Encounter: Payer: Self-pay | Admitting: Family Medicine

## 2024-01-08 VITALS — BP 110/70 | HR 97 | Temp 98.0°F | Resp 16 | Ht 73.0 in | Wt 207.4 lb

## 2024-01-08 DIAGNOSIS — R053 Chronic cough: Secondary | ICD-10-CM

## 2024-01-08 DIAGNOSIS — J45991 Cough variant asthma: Secondary | ICD-10-CM | POA: Diagnosis not present

## 2024-01-08 MED ORDER — FLUTICASONE FUROATE-VILANTEROL 100-25 MCG/ACT IN AEPB: 1.0000 | INHALATION_SPRAY | Freq: Every day | RESPIRATORY_TRACT | 1 refills | Status: AC

## 2024-01-08 NOTE — Patient Instructions (Signed)
 Let me know if there are cost issues.   If you do not hear anything about your referral in the next 1-2 weeks, call our office and ask for an update.  Let us  know if you need anything.

## 2024-01-08 NOTE — Progress Notes (Signed)
 Chief Complaint  Patient presents with   GI Problem    GI Problem and Dry Cough    Wayne Richardson is 18 y.o. and is here for a cough.  Here with mom.  Duration: several years Productive? No Associated symptoms: Intermittent wheezing/shortness of breath Denies: fever, night sweats, nasal congestion, rhinorrhea, sore throat, hemoptysis, association with position, association with meals Hx of GERD? No-takes omeprazole  for eosinophilic esophagitis ACEi? No Takes Flonase , levocetirizine, montelukast  for allergies.  Not affecting his cough. He does have a rescue inhaler which does help with the symptoms for short period time.  Undigested food Notices carrots and Rotini pasta in his stool. Does not always depend on what he eats.  Denies any pain, unintentional weight loss, bleeding, diarrhea, nausea, vomiting.  Past Medical History:  Diagnosis Date   Allergy    Asthma due to environmental allergies    cats, illness   Autistic disorder, current or active state    Eosinophilic esophagitis    Family History  Problem Relation Age of Onset   Cancer Father        sarcoma   Heart disease Neg Hx    Colon cancer Neg Hx    Esophageal cancer Neg Hx    Stomach cancer Neg Hx    Rectal cancer Neg Hx    Allergies as of 01/08/2024       Reactions   Erythromycin Rash   Rash on arms and legs.        Medication List        Accurate as of January 08, 2024 11:36 AM. If you have any questions, ask your nurse or doctor.          Benefiber Tabs Take by mouth.   cetirizine 10 MG tablet Commonly known as: ZYRTEC Take 10 mg by mouth daily. 1 po q d.   fluticasone  50 MCG/ACT nasal spray Commonly known as: FLONASE  SPRAY 2 SPRAYS INTO EACH NOSTRIL EVERY DAY   fluticasone  furoate-vilanterol 100-25 MCG/ACT Aepb Commonly known as: Breo Ellipta Inhale 1 puff into the lungs daily. Rinse mouth out after use. Started by: Mabel Pry, DO   montelukast  10 MG tablet Commonly known  as: SINGULAIR  Take 1 tablet (10 mg total) by mouth at bedtime.   omeprazole  40 MG capsule Commonly known as: PRILOSEC TAKE 1 CAPSULE (40 MG TOTAL) BY MOUTH IN THE MORNING AND AT BEDTIME.   Ventolin HFA 108 (90 Base) MCG/ACT inhaler Generic drug: albuterol Inhale 2 puffs into the lungs as needed. 2 Puffs PRN        BP 110/70 (BP Location: Left Arm, Patient Position: Sitting)   Pulse 97   Temp 98 F (36.7 C) (Oral)   Resp 16   Ht 6' 1 (1.854 m)   Wt 207 lb 6.4 oz (94.1 kg)   SpO2 98%   BMI 27.36 kg/m  Gen: Awake, alert, appears stated age HEENT: Ears neg, nares patent without D/C, turbinates unremarkable, Pharynx pink without exudate Neck: Supple, no masses or asymmetry, no tenderness Heart: RRR, no LE edema Lungs: CTAB, normal effort, no accessory muscle use Psych: normal mood and affect  Cough variant asthma - Plan: fluticasone  furoate-vilanterol (BREO ELLIPTA) 100-25 MCG/ACT AEPB  Chronic cough - Plan: Ambulatory referral to Pulmonology  1/2.  Chronic, not stable.  Add Breo 1 puff daily, rinse mouth after use.  He does have benefit with albuterol so having a long-acting beta agonist component should be helpful.  Will refer to pulmonology as well.  He has seen ENT, upper endoscopy was unremarkable.  Does not seem like upper airway cough syndrome or reflux. F/u in 4 weeks. The patient and his mother voiced understanding and agreement to the plan.  Mabel Mt [place], DO 01/08/2024 11:36 AM

## 2024-01-31 ENCOUNTER — Other Ambulatory Visit: Payer: Self-pay | Admitting: Family Medicine

## 2024-02-11 ENCOUNTER — Ambulatory Visit

## 2024-02-27 ENCOUNTER — Ambulatory Visit: Admitting: Pulmonary Disease
